# Patient Record
Sex: Female | Born: 1970 | Race: White | Hispanic: No | Marital: Married | Smoking: Never smoker
Health system: Southern US, Community
[De-identification: ages and names within clinical notes are randomized; demographics above are authoritative.]

## PROBLEM LIST (undated history)

## (undated) DIAGNOSIS — I10 Essential (primary) hypertension: Secondary | ICD-10-CM

## (undated) DIAGNOSIS — D649 Anemia, unspecified: Secondary | ICD-10-CM

## (undated) DIAGNOSIS — F419 Anxiety disorder, unspecified: Secondary | ICD-10-CM

## (undated) DIAGNOSIS — F329 Major depressive disorder, single episode, unspecified: Secondary | ICD-10-CM

## (undated) DIAGNOSIS — F32A Depression, unspecified: Secondary | ICD-10-CM

## (undated) DIAGNOSIS — E079 Disorder of thyroid, unspecified: Secondary | ICD-10-CM

---

## 2015-03-06 DIAGNOSIS — F419 Anxiety disorder, unspecified: Secondary | ICD-10-CM | POA: Insufficient documentation

## 2015-03-06 DIAGNOSIS — E0590 Thyrotoxicosis, unspecified without thyrotoxic crisis or storm: Secondary | ICD-10-CM | POA: Insufficient documentation

## 2015-03-06 DIAGNOSIS — F32A Depression, unspecified: Secondary | ICD-10-CM | POA: Insufficient documentation

## 2015-03-06 DIAGNOSIS — F329 Major depressive disorder, single episode, unspecified: Secondary | ICD-10-CM | POA: Insufficient documentation

## 2015-03-06 DIAGNOSIS — I10 Essential (primary) hypertension: Secondary | ICD-10-CM | POA: Insufficient documentation

## 2015-03-06 DIAGNOSIS — N393 Stress incontinence (female) (male): Secondary | ICD-10-CM | POA: Insufficient documentation

## 2015-03-06 DIAGNOSIS — E669 Obesity, unspecified: Secondary | ICD-10-CM | POA: Insufficient documentation

## 2017-05-12 DIAGNOSIS — G43009 Migraine without aura, not intractable, without status migrainosus: Secondary | ICD-10-CM | POA: Insufficient documentation

## 2018-02-12 ENCOUNTER — Observation Stay (HOSPITAL_COMMUNITY)
Admission: EM | Admit: 2018-02-12 | Discharge: 2018-02-14 | Disposition: A | Discharge status: Home / Self Care | Payer: PRIVATE HEALTH INSURANCE | Attending: General Surgery | Admitting: General Surgery

## 2018-02-12 ENCOUNTER — Encounter (HOSPITAL_COMMUNITY): Payer: Self-pay | Admitting: Emergency Medicine

## 2018-02-12 ENCOUNTER — Other Ambulatory Visit: Payer: Self-pay

## 2018-02-12 ENCOUNTER — Emergency Department (HOSPITAL_COMMUNITY): Payer: PRIVATE HEALTH INSURANCE

## 2018-02-12 DIAGNOSIS — K8010 Calculus of gallbladder with chronic cholecystitis without obstruction: Principal | ICD-10-CM | POA: Insufficient documentation

## 2018-02-12 DIAGNOSIS — R1011 Right upper quadrant pain: Secondary | ICD-10-CM

## 2018-02-12 DIAGNOSIS — K8000 Calculus of gallbladder with acute cholecystitis without obstruction: Secondary | ICD-10-CM

## 2018-02-12 DIAGNOSIS — K810 Acute cholecystitis: Secondary | ICD-10-CM | POA: Diagnosis present

## 2018-02-12 DIAGNOSIS — E669 Obesity, unspecified: Secondary | ICD-10-CM | POA: Diagnosis not present

## 2018-02-12 DIAGNOSIS — K7689 Other specified diseases of liver: Secondary | ICD-10-CM | POA: Diagnosis not present

## 2018-02-12 DIAGNOSIS — Z6834 Body mass index (BMI) 34.0-34.9, adult: Secondary | ICD-10-CM | POA: Diagnosis not present

## 2018-02-12 DIAGNOSIS — I10 Essential (primary) hypertension: Secondary | ICD-10-CM | POA: Diagnosis not present

## 2018-02-12 DIAGNOSIS — K819 Cholecystitis, unspecified: Secondary | ICD-10-CM | POA: Diagnosis present

## 2018-02-12 HISTORY — DX: Essential (primary) hypertension: I10

## 2018-02-12 LAB — COMPREHENSIVE METABOLIC PANEL
ALBUMIN: 3.8 g/dL (ref 3.5–5.0)
ALT: 52 U/L — AB (ref 0–44)
AST: 167 U/L — AB (ref 15–41)
Alkaline Phosphatase: 208 U/L — ABNORMAL HIGH (ref 38–126)
Anion gap: 9 (ref 5–15)
BUN: 13 mg/dL (ref 6–20)
CO2: 24 mmol/L (ref 22–32)
CREATININE: 0.72 mg/dL (ref 0.44–1.00)
Calcium: 9.8 mg/dL (ref 8.9–10.3)
Chloride: 105 mmol/L (ref 98–111)
GFR calc Af Amer: >60 mL/min (ref >60)
GFR calc non Af Amer: >60 mL/min (ref >60)
GLUCOSE: 127 mg/dL — AB (ref 70–99)
Potassium: 3.5 mmol/L (ref 3.5–5.1)
SODIUM: 138 mmol/L (ref 135–145)
Total Bilirubin: 0.9 mg/dL (ref 0.3–1.2)
Total Protein: 7.3 g/dL (ref 6.5–8.1)

## 2018-02-12 LAB — LIPASE, BLOOD: LIPASE: 23 U/L (ref 11–51)

## 2018-02-12 LAB — URINALYSIS, ROUTINE W REFLEX MICROSCOPIC
BILIRUBIN URINE: NEGATIVE
Bacteria, UA: NONE SEEN
Glucose, UA: NEGATIVE mg/dL
Ketones, ur: NEGATIVE mg/dL
Nitrite: NEGATIVE
PROTEIN: 100 mg/dL — AB
RBC / HPF: >50 RBC/hpf — ABNORMAL HIGH (ref 0–5)
SPECIFIC GRAVITY, URINE: 1.024 (ref 1.005–1.030)
pH: 7 (ref 5.0–8.0)

## 2018-02-12 LAB — CBC
HCT: 30.1 % — ABNORMAL LOW (ref 36.0–46.0)
Hemoglobin: 8.7 g/dL — ABNORMAL LOW (ref 12.0–15.0)
MCH: 19.6 pg — ABNORMAL LOW (ref 26.0–34.0)
MCHC: 28.9 g/dL — AB (ref 30.0–36.0)
MCV: 67.6 fL — AB (ref 80.0–100.0)
Platelets: 384 10*3/uL (ref 150–400)
RBC: 4.45 MIL/uL (ref 3.87–5.11)
RDW: 19 % — AB (ref 11.5–15.5)
WBC: 11.2 10*3/uL — ABNORMAL HIGH (ref 4.0–10.5)
nRBC: 0 % (ref 0.0–0.2)

## 2018-02-12 LAB — I-STAT BETA HCG BLOOD, ED (MC, WL, AP ONLY)

## 2018-02-12 MED ORDER — BUPROPION HCL ER (XL) 300 MG PO TB24
300.0000 mg | ORAL_TABLET | Freq: Every day | ORAL | Status: DC
  Administered 2018-02-12 – 2018-02-14 (×2): 300 mg via ORAL
  Filled 2018-02-12 (×2): qty 1

## 2018-02-12 MED ORDER — ENOXAPARIN SODIUM 40 MG/0.4ML ~~LOC~~ SOLN: 40.0000 mg | SUBCUTANEOUS | Status: DC

## 2018-02-12 MED ORDER — MORPHINE SULFATE (PF) 4 MG/ML IV SOLN
4.0000 mg | Freq: Once | INTRAVENOUS | Status: AC
  Administered 2018-02-12: 4 mg via INTRAVENOUS
  Filled 2018-02-12: qty 1

## 2018-02-12 MED ORDER — METHIMAZOLE 5 MG PO TABS
2.5000 mg | ORAL_TABLET | Freq: Every day | ORAL | Status: DC
  Administered 2018-02-12: 2.5 mg via ORAL
  Filled 2018-02-12 (×3): qty 1

## 2018-02-12 MED ORDER — ONDANSETRON 4 MG PO TBDP: 4.0000 mg | ORAL_TABLET | Freq: Four times a day (QID) | ORAL | Status: DC | PRN

## 2018-02-12 MED ORDER — VENLAFAXINE HCL ER 150 MG PO CP24
150.0000 mg | ORAL_CAPSULE | Freq: Every day | ORAL | Status: DC
  Administered 2018-02-13 – 2018-02-14 (×2): 150 mg via ORAL
  Filled 2018-02-12 (×2): qty 1

## 2018-02-12 MED ORDER — ACETAMINOPHEN 650 MG RE SUPP: 650.0000 mg | Freq: Four times a day (QID) | RECTAL | Status: DC | PRN

## 2018-02-12 MED ORDER — ONDANSETRON HCL 4 MG/2ML IJ SOLN
4.0000 mg | Freq: Once | INTRAMUSCULAR | Status: AC | PRN
  Administered 2018-02-12: 4 mg via INTRAVENOUS
  Filled 2018-02-12: qty 2

## 2018-02-12 MED ORDER — OXYCODONE HCL 5 MG PO TABS
5.0000 mg | ORAL_TABLET | ORAL | Status: DC | PRN
  Administered 2018-02-12 – 2018-02-14 (×5): 5 mg via ORAL
  Filled 2018-02-12 (×5): qty 1

## 2018-02-12 MED ORDER — SODIUM CHLORIDE 0.9 % IV SOLN
2.0000 g | INTRAVENOUS | Status: DC
  Administered 2018-02-12: 2 g via INTRAVENOUS
  Filled 2018-02-12 (×2): qty 20

## 2018-02-12 MED ORDER — ACETAMINOPHEN 325 MG PO TABS
650.0000 mg | ORAL_TABLET | Freq: Four times a day (QID) | ORAL | Status: DC | PRN
  Filled 2018-02-12: qty 2

## 2018-02-12 MED ORDER — ONDANSETRON HCL 4 MG/2ML IJ SOLN: 4.0000 mg | Freq: Four times a day (QID) | INTRAMUSCULAR | Status: DC | PRN

## 2018-02-12 MED ORDER — SODIUM CHLORIDE 0.9 % IV SOLN
INTRAVENOUS | Status: DC
  Administered 2018-02-12 – 2018-02-13 (×3): via INTRAVENOUS

## 2018-02-12 MED ORDER — HYDROMORPHONE HCL 1 MG/ML IJ SOLN
1.0000 mg | INTRAMUSCULAR | Status: DC | PRN
  Administered 2018-02-12 – 2018-02-14 (×5): 1 mg via INTRAVENOUS
  Filled 2018-02-12 (×5): qty 1

## 2018-02-12 NOTE — H&P (Signed)
Melissa Zamora is an 47 y.o. female.   Chief Complaint: ruq pain HPI: 57 yof consult from Dr Sherry Ruffing for ruq pain. She has several episodes of intermittent ruq pain over past couple months.  She had episode last night after eating that has not abated. Nothing is helping.  No n/v. No fevers. Having normal bowel function.  She came to er and has US showing gallstones, elevated wbc.    Past Medical History:  Diagnosis Date  . Hypertension     History reviewed. No pertinent surgical history.  History reviewed. No pertinent family history. Social History:  reports that she has never smoked. She has never used smokeless tobacco. She reports that she drinks alcohol. She reports that she does not use drugs.  Allergies: No Known Allergies  meds reviewed  Results for orders placed or performed during the hospital encounter of 02/12/18 (from the past 48 hour(s))  Lipase, blood     Status: None   Collection Time: 02/12/18  4:59 AM  Result Value Ref Range   Lipase 23 11 - 51 U/L    Comment: Performed at [facility], [place] [address], [place], [place] [address]  Comprehensive metabolic panel     Status: Abnormal   Collection Time: 02/12/18  4:59 AM  Result Value Ref Range   Sodium 138 135 - 145 mmol/L   Potassium 3.5 3.5 - 5.1 mmol/L   Chloride 105 98 - 111 mmol/L   CO2 24 22 - 32 mmol/L   Glucose, Bld 127 (H) 70 - 99 mg/dL   BUN 13 6 - 20 mg/dL   Creatinine, Ser 0.72 0.44 - 1.00 mg/dL   Calcium 9.8 8.9 - 10.3 mg/dL   Total Protein 7.3 6.5 - 8.1 g/dL   Albumin 3.8 3.5 - 5.0 g/dL   AST 167 (H) 15 - 41 U/L   ALT 52 (H) 0 - 44 U/L   Alkaline Phosphatase 208 (H) 38 - 126 U/L   Total Bilirubin 0.9 0.3 - 1.2 mg/dL   GFR calc non Af Amer >60 >60 mL/min   GFR calc Af Amer >60 >60 mL/min    Comment: (NOTE) The eGFR has been calculated using the CKD EPI equation. This calculation has not been validated in all clinical situations. eGFR's persistently <60 mL/min signify possible  Chronic Kidney Disease.    Anion gap 9 5 - 15    Comment: Performed at [facility], [place] [address], [place], [place] [address]  CBC     Status: Abnormal   Collection Time: 02/12/18  4:59 AM  Result Value Ref Range   WBC 11.2 (H) 4.0 - 10.5 K/uL   RBC 4.45 3.87 - 5.11 MIL/uL   Hemoglobin 8.7 (L) 12.0 - 15.0 g/dL    Comment: Reticulocyte Hemoglobin testing may be clinically indicated, consider ordering this additional test [id number]    HCT 30.1 (L) 36.0 - 46.0 %   MCV 67.6 (L) 80.0 - 100.0 fL   MCH 19.6 (L) 26.0 - 34.0 pg   MCHC 28.9 (L) 30.0 - 36.0 g/dL   RDW 19.0 (H) 11.5 - 15.5 %   Platelets 384 150 - 400 K/uL   nRBC 0.0 0.0 - 0.2 %    Comment: Performed at [facility], [place] [address] [place], [place] [address]  Urinalysis, Routine w reflex microscopic     Status: Abnormal   Collection Time: 02/12/18  4:59 AM  Result Value Ref Range   Color, Urine RED (A)  YELLOW    Comment: BIOCHEMICALS MAY BE AFFECTED BY COLOR   APPearance CLOUDY (A) CLEAR   Specific Gravity, Urine 1.024 1.005 - 1.030   pH 7.0 5.0 - 8.0   Glucose, UA NEGATIVE NEGATIVE mg/dL   Hgb urine dipstick LARGE (A) NEGATIVE   Bilirubin Urine NEGATIVE NEGATIVE   Ketones, ur NEGATIVE NEGATIVE mg/dL   Protein, ur 100 (A) NEGATIVE mg/dL   Nitrite NEGATIVE NEGATIVE   Leukocytes, UA TRACE (A) NEGATIVE   RBC / HPF >50 (H) 0 - 5 RBC/hpf   WBC, UA 0-5 0 - 5 WBC/hpf   Bacteria, UA NONE SEEN NONE SEEN   Squamous Epithelial / LPF 6-10 0 - 5   Mucus PRESENT     Comment: Performed at [facility], [place] [address] [place], [place] [address]  I-Stat beta hCG blood, ED     Status: None   Collection Time: 02/12/18  5:07 AM  Result Value Ref Range   I-stat hCG, quantitative <5.0 <5 mIU/mL   Comment 3            Comment:   GEST. AGE      CONC.  (mIU/mL)   <=1 WEEK        5 - 50     2 WEEKS       50 - 500     3 WEEKS       100 - 10,000     4 WEEKS     1,000 - 30,000         FEMALE AND NON-PREGNANT FEMALE:     LESS THAN 5 mIU/mL    US Abdomen Limited Ruq  Result Date: 02/12/2018 CLINICAL DATA:  Acute onset of right upper quadrant abdominal pain. EXAM: ULTRASOUND ABDOMEN LIMITED RIGHT UPPER QUADRANT COMPARISON:  None. FINDINGS: Gallbladder: Gallstones and sludge are noted within the gallbladder. Stones measure up to 1.9 cm in size. No ultrasonographic Murphy's sign is elicited. No pericholecystic fluid or gallbladder wall thickening is appreciated. Common bile duct: Diameter: 0.5 cm, within normal limits in caliber. Liver: A 2.4 x 1.8 x 1.8 cm cyst is noted at the right hepatic lobe. Within normal limits in parenchymal echogenicity. Portal vein is patent on color Doppler imaging with normal direction of blood flow towards the liver. IMPRESSION: 1. No acute abnormality seen at the right upper quadrant. 2. Stones and sludge noted within the gallbladder. Gallbladder otherwise unremarkable in appearance. 3. 2.4 cm right hepatic cyst noted. Electronically Signed   By: Garald Balding M.D.   On: 02/12/2018 06:36    Review of Systems  Gastrointestinal: Positive for abdominal pain.  All other systems reviewed and are negative.   Blood pressure (!) 164/90, pulse 87, temperature 98.5 F (36.9 C), temperature source Oral, resp. rate 15, height 5' 2"  (1.575 m), weight 86.2 kg, last menstrual period 02/09/2018, SpO2 98 %. Physical Exam  Vitals reviewed. Constitutional: She is oriented to person, place, and time. She appears well-developed and well-nourished.  HENT:  Head: Normocephalic and atraumatic.  Eyes: No scleral icterus.  Neck: Neck supple.  Cardiovascular: Normal rate, regular rhythm and normal heart sounds.  Respiratory: Effort normal. No respiratory distress.  GI: Soft. Bowel sounds are normal. There is tenderness (mild ruq).  Neurological: She is alert and oriented to person, place, and time.  Skin: Skin is warm and dry.  Psychiatric: Her behavior is  normal. Thought content normal.     Assessment/Plan Biliary colic, ? Cholecystitis Due to persistent symptoms I  recommended admission and lap chole. I discussed the procedure in detail. We discussed the risks and benefits of a laparoscopic cholecystectomy and possible cholangiogram including, but not limited to bleeding, infection, injury to surrounding structures such as the intestine or liver, bile leak, retained gallstones, need to convert to an open procedure, prolonged diarrhea, blood clots such as  DVT, common bile duct injury, anesthesia risks, and possible need for additional procedures.  The likelihood of improvement in symptoms and return to the patient's normal status is good. We discussed the typical post-operative recovery course.   Rolm Bookbinder, MD 02/12/2018, 8:15 AM

## 2018-02-12 NOTE — ED Notes (Signed)
Short stay called to state pt will have surgery tomorrow at 3pm due to several scheduled today. Ngozi RN aware

## 2018-02-12 NOTE — ED Triage Notes (Signed)
Patient is complaining of upper left abdominal pain that wraps to her back. Patient states this started last night. Patient is nauseas.

## 2018-02-12 NOTE — ED Provider Notes (Signed)
[facility] DEPT Provider Note   CSN: [id number] Arrival date & time: 02/12/18  0416     History   Chief Complaint Chief Complaint  Patient presents with  . Abdominal Pain  . Back Pain    HPI Melissa Zamora is a 47 y.o. female with history of HTN who presents to the emergency department with a chief complaint of abdominal pain.  The patient endorses constants upper abdominal pain that radiates to her bilateral mid back.  She is unable to characterize the pain, but states it is severe.  Pain began suddenly at 20:30.  She reports she ate a cheeseburger approximately 2 hours prior to onset of pain.  She reports associated nausea.  No known modifying factors, but she states when the pain is at its worst that it takes her breath away.  She reports 3 previous similar episodes of pain over the last month.  She reports the last episode of pain resolved spontaneously after 2 to 3 hours and began after eating chili.  She denies of fever, chills, vomiting, diarrhea, constipation, dysuria, hematuria, flank pain, vaginal pain or discharge, chest pain, or dyspnea.  No treatment prior to arrival.  No history of abdominal surgery.  LMP began 02/09/2018 and she is currently having vaginal bleeding.  Reports that she has only had 3 menstrual cycles in 2019, but she has a history of heavy prolonged periods that will last anywhere from a few days to a few weeks.  The history is provided by the patient. No language interpreter was used.    Past Medical History:  Diagnosis Date  . Hypertension     There are no active problems to display for this patient.   History reviewed. No pertinent surgical history.   OB History   None      Home Medications    Prior to Admission medications   Not on File    Family History History reviewed. No pertinent family history.  Social History Social History   Tobacco Use  . Smoking status: Never Smoker  . Smokeless tobacco:  Never Used  Substance Use Topics  . Alcohol use: Yes  . Drug use: Never     Allergies   Patient has no known allergies.   Review of Systems Review of Systems  Constitutional: Negative for activity change, chills and fever.  Respiratory: Negative for shortness of breath.   Cardiovascular: Negative for chest pain.  Gastrointestinal: Positive for abdominal pain and nausea. Negative for constipation, diarrhea and vomiting.  Genitourinary: Negative for dysuria, flank pain, frequency, urgency, vaginal discharge and vaginal pain.  Musculoskeletal: Negative for back pain, myalgias and neck pain.  Skin: Negative for rash.  Allergic/Immunologic: Negative for immunocompromised state.  Neurological: Negative for weakness, numbness and headaches.  Psychiatric/Behavioral: Negative for confusion.   Physical Exam Updated Vital Signs BP (!) 161/92   Pulse 87   Resp 16   Ht 5' 2"  (1.575 m)   Wt 86.2 kg   LMP 02/09/2018   SpO2 100%   BMI 34.75 kg/m   Physical Exam  Constitutional: No distress.  HENT:  Head: Normocephalic.  Eyes: Conjunctivae are normal.  Neck: Neck supple.  Cardiovascular: Normal rate, regular rhythm, normal heart sounds and intact distal pulses. Exam reveals no gallop and no friction rub.  No murmur heard. Pulmonary/Chest: Effort normal. No stridor. No respiratory distress. She has no wheezes. She has no rales. She exhibits no tenderness.  Abdominal: Soft. Bowel sounds are normal. She exhibits no distension  and no mass. There is tenderness. There is guarding. There is no rebound. No hernia.  Tender to palpation in the epigastric region and right upper quadrant.  Maximal pain is in the right upper quadrant.  Positive Murphy sign.  There is guarding, but no rebound.  No CVA tenderness bilaterally.  Lower abdomen is unremarkable.  Neurological: She is alert.  Skin: Skin is warm. No rash noted.  Psychiatric: Her behavior is normal.  Nursing note and vitals  reviewed.    ED Treatments / Results  Labs (all labs ordered are listed, but only abnormal results are displayed) Labs Reviewed  COMPREHENSIVE METABOLIC PANEL - Abnormal; Notable for the following components:      Result Value   Glucose, Bld 127 (*)    AST 167 (*)    ALT 52 (*)    Alkaline Phosphatase 208 (*)    All other components within normal limits  CBC - Abnormal; Notable for the following components:   WBC 11.2 (*)    Hemoglobin 8.7 (*)    HCT 30.1 (*)    MCV 67.6 (*)    MCH 19.6 (*)    MCHC 28.9 (*)    RDW 19.0 (*)    All other components within normal limits  URINALYSIS, ROUTINE W REFLEX MICROSCOPIC - Abnormal; Notable for the following components:   Color, Urine RED (*)    APPearance CLOUDY (*)    Hgb urine dipstick LARGE (*)    Protein, ur 100 (*)    Leukocytes, UA TRACE (*)    RBC / HPF >50 (*)    All other components within normal limits  LIPASE, BLOOD  I-STAT BETA HCG BLOOD, ED ([facility], [facility], [facility] ONLY)    EKG None  Radiology US Abdomen Limited Ruq  Result Date: 02/12/2018 CLINICAL DATA:  Acute onset of right upper quadrant abdominal pain. EXAM: ULTRASOUND ABDOMEN LIMITED RIGHT UPPER QUADRANT COMPARISON:  None. FINDINGS: Gallbladder: Gallstones and sludge are noted within the gallbladder. Stones measure up to 1.9 cm in size. No ultrasonographic Murphy's sign is elicited. No pericholecystic fluid or gallbladder wall thickening is appreciated. Common bile duct: Diameter: 0.5 cm, within normal limits in caliber. Liver: A 2.4 x 1.8 x 1.8 cm cyst is noted at the right hepatic lobe. Within normal limits in parenchymal echogenicity. Portal vein is patent on color Doppler imaging with normal direction of blood flow towards the liver. IMPRESSION: 1. No acute abnormality seen at the right upper quadrant. 2. Stones and sludge noted within the gallbladder. Gallbladder otherwise unremarkable in appearance. 3. 2.4 cm right hepatic cyst noted. Electronically Signed   By: Garald Balding M.D.   On: 02/12/2018 06:36    Procedures Procedures (including critical care time)  Medications Ordered in ED Medications  ondansetron (ZOFRAN) injection 4 mg (4 mg Intravenous Given 02/12/18 0600)  morphine 4 MG/ML injection 4 mg (4 mg Intravenous Given 02/12/18 0600)  morphine 4 MG/ML injection 4 mg (4 mg Intravenous Given 02/12/18 0624)     Initial Impression / Assessment and Plan / ED Course  I have reviewed the triage vital signs and the nursing notes.  Pertinent labs & imaging results that were available during my care of the patient were reviewed by me and considered in my medical decision making (see chart for details).     47 year old female with a history of hypertension presenting with abdominal pain and nausea, onset 2030.  She reports 3 previous episodes of similar pain within the last month.  Pain  began approximately 2 hours after eating a cheeseburger.  On exam, she has a positive Murphy sign and is tender in the upper abdomen, more focally in the right upper quadrant. Will order basic labs and right upper quadrant ultrasound.  Leukocytosis of 11.2.  AST 167, ALT 52, Alk phos 208.  Right upper quadrant ultrasound with multiple gallstones measuring up to 1.9 cm in size.  No acute abnormality on ultrasound.  She is anemic with a hemoglobin of 8.7.  In 2016, hemoglobin was between 12 and 13 when reviewed through care everywhere.  Suspect this is secondary to abnormal uterine bleeding.  UA with hematuria, which is secondary to the patient's current menstrual cycle.  Consulted general surgery and spoke with Dr. Johney Maine. Surgery will come to see the patient. Patient care transferred to PA Geiple at the end of my shift. Patient presentation, ED course, and plan of care discussed with review of all pertinent labs and imaging. Please see his/her note for further details regarding further ED course and disposition.  Final Clinical Impressions(s) / ED Diagnoses   Final  diagnoses:  RUQ abdominal pain    ED Discharge Orders    None       Joanne Gavel, PA-C 02/12/18 0711    Shanon Rosser, MD 02/12/18 2244

## 2018-02-12 NOTE — ED Notes (Signed)
ED TO INPATIENT HANDOFF REPORT  Name/Age/Gender Melissa Zamora 47 y.o. female  Code Status    Code Status Orders  (From admission, onward)         Start     Ordered   02/12/18 1323  Full code  Continuous     02/12/18 1322        Code Status History    This patient has a current code status but no historical code status.      Home/SNF/Other Home  Chief Complaint Abdominal/Back Pain  Level of Care/Admitting Diagnosis ED Disposition    ED Disposition Condition Comment   Admit  Hospital Area: [facility] [[id number]]  Level of Care: Med-Surg [16]  Diagnosis: Acute cholecystitis [575.0.ICD-9-CM]  Admitting Physician: Rolm Bookbinder [[id number]]  Attending Physician: CCS, MD [[id number]]  Bed request comments: 5W  PT Class (Do Not Modify): Observation [104]  PT Acc Code (Do Not Modify): Observation [10022]       Medical History Past Medical History:  Diagnosis Date  . Hypertension     Allergies No Known Allergies  IV Location/Drains/Wounds Patient Lines/Drains/Airways Status   Active Line/Drains/Airways    Name:   Placement date:   Placement time:   Site:   Days:   Peripheral IV 02/12/18 Right Hand   02/12/18    0600    Hand   less than 1          Labs/Imaging Results for orders placed or performed during the hospital encounter of 02/12/18 (from the past 48 hour(s))  Lipase, blood     Status: None   Collection Time: 02/12/18  4:59 AM  Result Value Ref Range   Lipase 23 11 - 51 U/L    Comment: Performed at [facility], [place] [address] [place], [place] [address]  Comprehensive metabolic panel     Status: Abnormal   Collection Time: 02/12/18  4:59 AM  Result Value Ref Range   Sodium 138 135 - 145 mmol/L   Potassium 3.5 3.5 - 5.1 mmol/L   Chloride 105 98 - 111 mmol/L   CO2 24 22 - 32 mmol/L   Glucose, Bld 127 (H) 70 - 99 mg/dL   BUN 13 6 - 20 mg/dL   Creatinine, Ser 0.72 0.44 - 1.00 mg/dL   Calcium 9.8 8.9 - 10.3 mg/dL    Total Protein 7.3 6.5 - 8.1 g/dL   Albumin 3.8 3.5 - 5.0 g/dL   AST 167 (H) 15 - 41 U/L   ALT 52 (H) 0 - 44 U/L   Alkaline Phosphatase 208 (H) 38 - 126 U/L   Total Bilirubin 0.9 0.3 - 1.2 mg/dL   GFR calc non Af Amer >60 >60 mL/min   GFR calc Af Amer >60 >60 mL/min    Comment: (NOTE) The eGFR has been calculated using the CKD EPI equation. This calculation has not been validated in all clinical situations. eGFR's persistently <60 mL/min signify possible Chronic Kidney Disease.    Anion gap 9 5 - 15    Comment: Performed at [facility], [place] [address], [place], [place] [address]  CBC     Status: Abnormal   Collection Time: 02/12/18  4:59 AM  Result Value Ref Range   WBC 11.2 (H) 4.0 - 10.5 K/uL   RBC 4.45 3.87 - 5.11 MIL/uL   Hemoglobin 8.7 (L) 12.0 - 15.0 g/dL    Comment: Reticulocyte Hemoglobin testing may be clinically indicated, consider ordering this additional test [id number]  HCT 30.1 (L) 36.0 - 46.0 %   MCV 67.6 (L) 80.0 - 100.0 fL   MCH 19.6 (L) 26.0 - 34.0 pg   MCHC 28.9 (L) 30.0 - 36.0 g/dL   RDW 19.0 (H) 11.5 - 15.5 %   Platelets 384 150 - 400 K/uL   nRBC 0.0 0.0 - 0.2 %    Comment: Performed at [facility], [place] [address] [place], [place] [address]  Urinalysis, Routine w reflex microscopic     Status: Abnormal   Collection Time: 02/12/18  4:59 AM  Result Value Ref Range   Color, Urine RED (A) YELLOW    Comment: BIOCHEMICALS MAY BE AFFECTED BY COLOR   APPearance CLOUDY (A) CLEAR   Specific Gravity, Urine 1.024 1.005 - 1.030   pH 7.0 5.0 - 8.0   Glucose, UA NEGATIVE NEGATIVE mg/dL   Hgb urine dipstick LARGE (A) NEGATIVE   Bilirubin Urine NEGATIVE NEGATIVE   Ketones, ur NEGATIVE NEGATIVE mg/dL   Protein, ur 100 (A) NEGATIVE mg/dL   Nitrite NEGATIVE NEGATIVE   Leukocytes, UA TRACE (A) NEGATIVE   RBC / HPF >50 (H) 0 - 5 RBC/hpf   WBC, UA 0-5 0 - 5 WBC/hpf   Bacteria, UA NONE SEEN NONE SEEN   Squamous Epithelial /  LPF 6-10 0 - 5   Mucus PRESENT     Comment: Performed at [facility], [place] [address] [place],  [address]  I-Stat beta hCG blood, ED     Status: None   Collection Time: 02/12/18  5:07 AM  Result Value Ref Range   I-stat hCG, quantitative <5.0 <5 mIU/mL   Comment 3            Comment:   GEST. AGE      CONC.  (mIU/mL)   <=1 WEEK        5 - 50     2 WEEKS       50 - 500     3 WEEKS       100 - 10,000     4 WEEKS     1,000 - 30,000        FEMALE AND NON-PREGNANT FEMALE:     LESS THAN 5 mIU/mL    US Abdomen Limited Ruq  Result Date: 02/12/2018 CLINICAL DATA:  Acute onset of right upper quadrant abdominal pain. EXAM: ULTRASOUND ABDOMEN LIMITED RIGHT UPPER QUADRANT COMPARISON:  None. FINDINGS: Gallbladder: Gallstones and sludge are noted within the gallbladder. Stones measure up to 1.9 cm in size. No ultrasonographic Murphy's sign is elicited. No pericholecystic fluid or gallbladder wall thickening is appreciated. Common bile duct: Diameter: 0.5 cm, within normal limits in caliber. Liver: A 2.4 x 1.8 x 1.8 cm cyst is noted at the right hepatic lobe. Within normal limits in parenchymal echogenicity. Portal vein is patent on color Doppler imaging with normal direction of blood flow towards the liver. IMPRESSION: 1. No acute abnormality seen at the right upper quadrant. 2. Stones and sludge noted within the gallbladder. Gallbladder otherwise unremarkable in appearance. 3. 2.4 cm right hepatic cyst noted. Electronically Signed   By: Garald Balding M.D.   On: 02/12/2018 06:36    Pending Labs Unresulted Labs (From admission, onward)    Start     Ordered   02/19/18 0500  Creatinine, serum  (enoxaparin (LOVENOX)    CrCl >/= 30 ml/min)  Weekly,   R    Comments:  while on enoxaparin therapy    02/12/18  1322   02/13/18 0500  Comprehensive metabolic panel  Tomorrow morning,   R     02/12/18 1323   02/13/18 0500  CBC  Tomorrow morning,   R     02/12/18 1323           Vitals/Pain Today's Vitals   02/12/18 0900 02/12/18 1000 02/12/18 1200 02/12/18 1411  BP: (!) 166/99 (!) 153/92 127/74 (!) 141/89  Pulse: 84 82 82 85  Resp:  16 16 16   Temp:      TempSrc:      SpO2: 98% 97% 98% 99%  Weight:      Height:      PainSc:        Isolation Precautions No active isolations  Medications Medications  enoxaparin (LOVENOX) injection 40 mg (has no administration in time range)  0.9 %  sodium chloride infusion ( Intravenous New Bag/Given 02/12/18 1435)  cefTRIAXone (ROCEPHIN) 2 g in sodium chloride 0.9 % 100 mL IVPB (2 g Intravenous New Bag/Given 02/12/18 1438)  acetaminophen (TYLENOL) tablet 650 mg (has no administration in time range)    Or  acetaminophen (TYLENOL) suppository 650 mg (has no administration in time range)  ondansetron (ZOFRAN-ODT) disintegrating tablet 4 mg (has no administration in time range)    Or  ondansetron (ZOFRAN) injection 4 mg (has no administration in time range)  ondansetron (ZOFRAN) injection 4 mg (4 mg Intravenous Given 02/12/18 0600)  morphine 4 MG/ML injection 4 mg (4 mg Intravenous Given 02/12/18 0600)  morphine 4 MG/ML injection 4 mg (4 mg Intravenous Given 02/12/18 0624)    Mobility walks

## 2018-02-13 ENCOUNTER — Encounter (HOSPITAL_COMMUNITY): Payer: Self-pay | Admitting: Registered Nurse

## 2018-02-13 ENCOUNTER — Observation Stay (HOSPITAL_COMMUNITY): Payer: PRIVATE HEALTH INSURANCE | Admitting: Registered Nurse

## 2018-02-13 ENCOUNTER — Encounter (HOSPITAL_COMMUNITY)
Admission: EM | Disposition: A | Discharge status: Home / Self Care | Payer: Self-pay | Source: Home / Self Care | Attending: Emergency Medicine

## 2018-02-13 HISTORY — PX: CHOLECYSTECTOMY: SHX55

## 2018-02-13 LAB — COMPREHENSIVE METABOLIC PANEL
ALBUMIN: 3.4 g/dL — AB (ref 3.5–5.0)
ALT: 76 U/L — ABNORMAL HIGH (ref 0–44)
AST: 91 U/L — AB (ref 15–41)
Alkaline Phosphatase: 159 U/L — ABNORMAL HIGH (ref 38–126)
Anion gap: 7 (ref 5–15)
BILIRUBIN TOTAL: 0.4 mg/dL (ref 0.3–1.2)
BUN: 8 mg/dL (ref 6–20)
CALCIUM: 8.1 mg/dL — AB (ref 8.9–10.3)
CO2: 26 mmol/L (ref 22–32)
Chloride: 107 mmol/L (ref 98–111)
Creatinine, Ser: 0.65 mg/dL (ref 0.44–1.00)
GFR calc Af Amer: >60 mL/min (ref >60)
GLUCOSE: 102 mg/dL — AB (ref 70–99)
Potassium: 3 mmol/L — ABNORMAL LOW (ref 3.5–5.1)
Sodium: 140 mmol/L (ref 135–145)
TOTAL PROTEIN: 6.4 g/dL — AB (ref 6.5–8.1)

## 2018-02-13 LAB — CBC
HCT: 29.5 % — ABNORMAL LOW (ref 36.0–46.0)
Hemoglobin: 8.3 g/dL — ABNORMAL LOW (ref 12.0–15.0)
MCH: 19.2 pg — AB (ref 26.0–34.0)
MCHC: 28.1 g/dL — AB (ref 30.0–36.0)
MCV: 68.3 fL — ABNORMAL LOW (ref 80.0–100.0)
PLATELETS: 301 10*3/uL (ref 150–400)
RBC: 4.32 MIL/uL (ref 3.87–5.11)
RDW: 19.1 % — AB (ref 11.5–15.5)
WBC: 7.3 10*3/uL (ref 4.0–10.5)
nRBC: 0 % (ref 0.0–0.2)

## 2018-02-13 SURGERY — LAPAROSCOPIC CHOLECYSTECTOMY WITH INTRAOPERATIVE CHOLANGIOGRAM
Anesthesia: General

## 2018-02-13 MED ORDER — ONDANSETRON HCL 4 MG/2ML IJ SOLN
INTRAMUSCULAR | Status: DC | PRN
  Administered 2018-02-13: 4 mg via INTRAVENOUS

## 2018-02-13 MED ORDER — MEPERIDINE HCL 50 MG/ML IJ SOLN: 6.2500 mg | INTRAMUSCULAR | Status: DC | PRN

## 2018-02-13 MED ORDER — LACTATED RINGERS IV SOLN
INTRAVENOUS | Status: AC | PRN
  Administered 2018-02-13: 1000 mL

## 2018-02-13 MED ORDER — OXYCODONE HCL 5 MG PO TABS: 5.0000 mg | ORAL_TABLET | Freq: Once | ORAL | Status: DC | PRN

## 2018-02-13 MED ORDER — SUGAMMADEX SODIUM 200 MG/2ML IV SOLN
INTRAVENOUS | Status: DC | PRN
  Administered 2018-02-13: 200 mg via INTRAVENOUS

## 2018-02-13 MED ORDER — LIP MEDEX EX OINT
TOPICAL_OINTMENT | CUTANEOUS | Status: AC
  Administered 2018-02-13: 16:00:00
  Filled 2018-02-13: qty 7

## 2018-02-13 MED ORDER — ROCURONIUM BROMIDE 10 MG/ML (PF) SYRINGE
PREFILLED_SYRINGE | INTRAVENOUS | Status: AC
  Filled 2018-02-13: qty 10

## 2018-02-13 MED ORDER — OXYCODONE HCL 5 MG/5ML PO SOLN
5.0000 mg | Freq: Once | ORAL | Status: DC | PRN
  Filled 2018-02-13: qty 5

## 2018-02-13 MED ORDER — MIDAZOLAM HCL 2 MG/2ML IJ SOLN
INTRAMUSCULAR | Status: AC
  Filled 2018-02-13: qty 2

## 2018-02-13 MED ORDER — IBUPROFEN 200 MG PO TABS
400.0000 mg | ORAL_TABLET | Freq: Three times a day (TID) | ORAL | Status: DC
  Administered 2018-02-13 – 2018-02-14 (×3): 400 mg via ORAL
  Filled 2018-02-13 (×3): qty 2

## 2018-02-13 MED ORDER — HYDROMORPHONE HCL 1 MG/ML IJ SOLN
INTRAMUSCULAR | Status: AC
  Filled 2018-02-13: qty 1

## 2018-02-13 MED ORDER — 0.9 % SODIUM CHLORIDE (POUR BTL) OPTIME
TOPICAL | Status: DC | PRN
  Administered 2018-02-13: 1000 mL

## 2018-02-13 MED ORDER — PROPOFOL 10 MG/ML IV BOLUS
INTRAVENOUS | Status: AC
  Filled 2018-02-13: qty 20

## 2018-02-13 MED ORDER — FENTANYL CITRATE (PF) 250 MCG/5ML IJ SOLN
INTRAMUSCULAR | Status: AC
  Filled 2018-02-13: qty 5

## 2018-02-13 MED ORDER — LIDOCAINE 2% (20 MG/ML) 5 ML SYRINGE
INTRAMUSCULAR | Status: AC
  Filled 2018-02-13: qty 5

## 2018-02-13 MED ORDER — ONDANSETRON HCL 4 MG/2ML IJ SOLN
INTRAMUSCULAR | Status: AC
  Filled 2018-02-13: qty 2

## 2018-02-13 MED ORDER — CEFAZOLIN SODIUM-DEXTROSE 2-4 GM/100ML-% IV SOLN
INTRAVENOUS | Status: AC
  Filled 2018-02-13: qty 100

## 2018-02-13 MED ORDER — SODIUM CHLORIDE 0.9 % IV SOLN
INTRAVENOUS | Status: DC
  Administered 2018-02-13: 16:00:00 via INTRAVENOUS

## 2018-02-13 MED ORDER — BUPIVACAINE HCL (PF) 0.25 % IJ SOLN
INTRAMUSCULAR | Status: DC | PRN
  Administered 2018-02-13: 20 mL

## 2018-02-13 MED ORDER — CEFAZOLIN SODIUM-DEXTROSE 2-3 GM-%(50ML) IV SOLR
INTRAVENOUS | Status: DC | PRN
  Administered 2018-02-13: 2 g via INTRAVENOUS

## 2018-02-13 MED ORDER — BUPIVACAINE HCL (PF) 0.25 % IJ SOLN
INTRAMUSCULAR | Status: AC
  Filled 2018-02-13: qty 30

## 2018-02-13 MED ORDER — PROMETHAZINE HCL 25 MG/ML IJ SOLN: 6.2500 mg | INTRAMUSCULAR | Status: DC | PRN

## 2018-02-13 MED ORDER — DEXAMETHASONE SODIUM PHOSPHATE 10 MG/ML IJ SOLN
INTRAMUSCULAR | Status: AC
  Filled 2018-02-13: qty 1

## 2018-02-13 MED ORDER — HYDROMORPHONE HCL 1 MG/ML IJ SOLN
0.2500 mg | INTRAMUSCULAR | Status: DC | PRN
  Administered 2018-02-13 (×3): 0.5 mg via INTRAVENOUS

## 2018-02-13 MED ORDER — ROCURONIUM BROMIDE 10 MG/ML (PF) SYRINGE
PREFILLED_SYRINGE | INTRAVENOUS | Status: DC | PRN
  Administered 2018-02-13: 50 mg via INTRAVENOUS

## 2018-02-13 MED ORDER — LIDOCAINE 2% (20 MG/ML) 5 ML SYRINGE
INTRAMUSCULAR | Status: DC | PRN
  Administered 2018-02-13: 60 mg via INTRAVENOUS

## 2018-02-13 MED ORDER — PROPOFOL 10 MG/ML IV BOLUS
INTRAVENOUS | Status: DC | PRN
  Administered 2018-02-13: 160 mg via INTRAVENOUS

## 2018-02-13 MED ORDER — MORPHINE SULFATE (PF) 4 MG/ML IV SOLN: 1.0000 mg | INTRAVENOUS | Status: DC | PRN

## 2018-02-13 MED ORDER — DEXAMETHASONE SODIUM PHOSPHATE 10 MG/ML IJ SOLN
INTRAMUSCULAR | Status: DC | PRN
  Administered 2018-02-13: 10 mg via INTRAVENOUS

## 2018-02-13 MED ORDER — SUGAMMADEX SODIUM 200 MG/2ML IV SOLN
INTRAVENOUS | Status: AC
  Filled 2018-02-13: qty 2

## 2018-02-13 MED ORDER — MIDAZOLAM HCL 5 MG/5ML IJ SOLN
INTRAMUSCULAR | Status: DC | PRN
  Administered 2018-02-13: 2 mg via INTRAVENOUS

## 2018-02-13 MED ORDER — OXYCODONE HCL 5 MG PO TABS: 5.0000 mg | ORAL_TABLET | ORAL | 0 refills | Status: AC | PRN

## 2018-02-13 MED ORDER — LACTATED RINGERS IV SOLN
INTRAVENOUS | Status: DC
  Administered 2018-02-13: 11:00:00 via INTRAVENOUS

## 2018-02-13 MED ORDER — FENTANYL CITRATE (PF) 250 MCG/5ML IJ SOLN
INTRAMUSCULAR | Status: DC | PRN
  Administered 2018-02-13: 50 ug via INTRAVENOUS
  Administered 2018-02-13: 100 ug via INTRAVENOUS
  Administered 2018-02-13 (×2): 50 ug via INTRAVENOUS

## 2018-02-13 SURGICAL SUPPLY — 44 items
APPLIER CLIP 5 13 M/L LIGAMAX5 (MISCELLANEOUS) ×3
APPLIER CLIP ROT 10 11.4 M/L (STAPLE)
BENZOIN TINCTURE PRP APPL 2/3 (GAUZE/BANDAGES/DRESSINGS) IMPLANT
CABLE HIGH FREQUENCY MONO STRZ (ELECTRODE) ×3 IMPLANT
CHLORAPREP W/TINT 26ML (MISCELLANEOUS) ×3 IMPLANT
CLIP APPLIE 5 13 M/L LIGAMAX5 (MISCELLANEOUS) ×1 IMPLANT
CLIP APPLIE ROT 10 11.4 M/L (STAPLE) IMPLANT
CLOSURE WOUND 1/2 X4 (GAUZE/BANDAGES/DRESSINGS)
COVER MAYO STAND STRL (DRAPES) IMPLANT
COVER SURGICAL LIGHT HANDLE (MISCELLANEOUS) ×3 IMPLANT
COVER WAND RF STERILE (DRAPES) IMPLANT
DECANTER SPIKE VIAL GLASS SM (MISCELLANEOUS) ×3 IMPLANT
DERMABOND ADVANCED (GAUZE/BANDAGES/DRESSINGS) ×2
DERMABOND ADVANCED .7 DNX12 (GAUZE/BANDAGES/DRESSINGS) ×1 IMPLANT
DEVICE TROCAR PUNCTURE CLOSURE (ENDOMECHANICALS) ×3 IMPLANT
DRAPE C-ARM 42X120 X-RAY (DRAPES) IMPLANT
ELECT REM PT RETURN 15FT ADLT (MISCELLANEOUS) ×3 IMPLANT
GLOVE BIO SURGEON STRL SZ7 (GLOVE) ×3 IMPLANT
GLOVE BIOGEL PI IND STRL 7.5 (GLOVE) ×1 IMPLANT
GLOVE BIOGEL PI INDICATOR 7.5 (GLOVE) ×2
GOWN STRL REUS W/TWL LRG LVL3 (GOWN DISPOSABLE) ×3 IMPLANT
GOWN STRL REUS W/TWL XL LVL3 (GOWN DISPOSABLE) ×6 IMPLANT
HEMOSTAT SNOW SURGICEL 2X4 (HEMOSTASIS) ×2 IMPLANT
KIT BASIN OR (CUSTOM PROCEDURE TRAY) ×3 IMPLANT
POUCH RETRIEVAL ECOSAC 10 (ENDOMECHANICALS) ×1 IMPLANT
POUCH RETRIEVAL ECOSAC 10MM (ENDOMECHANICALS) ×2
PROTECTOR NERVE ULNAR (MISCELLANEOUS) IMPLANT
SCISSORS LAP 5X35 DISP (ENDOMECHANICALS) ×3 IMPLANT
SET CHOLANGIOGRAPH MIX (MISCELLANEOUS) IMPLANT
SET IRRIG TUBING LAPAROSCOPIC (IRRIGATION / IRRIGATOR) ×3 IMPLANT
SLEEVE XCEL OPT CAN 5 100 (ENDOMECHANICALS) ×6 IMPLANT
STRIP CLOSURE SKIN 1/2X4 (GAUZE/BANDAGES/DRESSINGS) IMPLANT
SURGICEL SNOW 2X4 (HEMOSTASIS) ×3 IMPLANT
SUT MNCRL AB 4-0 PS2 18 (SUTURE) ×3 IMPLANT
SUT VICRYL 0 TIES 12 18 (SUTURE) IMPLANT
SUT VICRYL 0 UR6 27IN ABS (SUTURE) IMPLANT
TAPE STRIPS DRAPE STRL (GAUZE/BANDAGES/DRESSINGS) ×3 IMPLANT
TOWEL OR 17X26 10 PK STRL BLUE (TOWEL DISPOSABLE) ×3 IMPLANT
TOWEL OR NON WOVEN STRL DISP B (DISPOSABLE) ×3 IMPLANT
TRAY LAPAROSCOPIC (CUSTOM PROCEDURE TRAY) ×3 IMPLANT
TROCAR BLADELESS OPT 5 100 (ENDOMECHANICALS) ×3 IMPLANT
TROCAR XCEL BLUNT TIP 100MML (ENDOMECHANICALS) ×3 IMPLANT
TROCAR XCEL NON-BLD 11X100MML (ENDOMECHANICALS) IMPLANT
TUBING INSUF HEATED (TUBING) ×3 IMPLANT

## 2018-02-13 NOTE — Transfer of Care (Signed)
Immediate Anesthesia Transfer of Care Note  Patient: Melissa Zamora  Procedure(s) Performed: LAPAROSCOPIC CHOLECYSTECTOMY (N/A )  Patient Location: PACU  Anesthesia Type:General  Level of Consciousness: sedated  Airway & Oxygen Therapy: Patient Spontanous Breathing and Patient connected to face mask oxygen  Post-op Assessment: Report given to RN and Post -op Vital signs reviewed and stable  Post vital signs: Reviewed and stable  Last Vitals:  Vitals Value Taken Time  BP    Temp    Pulse 91 02/13/2018  1:29 PM  Resp 10 02/13/2018  1:29 PM  SpO2 96 % 02/13/2018  1:29 PM  Vitals shown include unvalidated device data.  Last Pain:  Vitals:   02/13/18 1028  TempSrc:   PainSc: 10-Worst pain ever      Patients Stated Pain Goal: 2 (02/13/18 1028)  Complications: No apparent anesthesia complications

## 2018-02-13 NOTE — Anesthesia Procedure Notes (Signed)
Procedure Name: Intubation Date/Time: 02/13/2018 12:29 PM Performed by: Talbot Grumbling, CRNA Pre-anesthesia Checklist: Patient identified, Emergency Drugs available, Suction available and Patient being monitored Patient Re-evaluated:Patient Re-evaluated prior to induction Oxygen Delivery Method: Circle system utilized Preoxygenation: Pre-oxygenation with 100% oxygen Induction Type: IV induction Ventilation: Mask ventilation without difficulty Laryngoscope Size: Mac and 3 Grade View: Grade I Tube type: Oral Tube size: 7.0 mm Number of attempts: 1 Airway Equipment and Method: Stylet Placement Confirmation: ETT inserted through vocal cords under direct vision,  positive ETCO2 and breath sounds checked- equal and bilateral Secured at: 21 cm Tube secured with: Tape Dental Injury: Teeth and Oropharynx as per pre-operative assessment

## 2018-02-13 NOTE — Progress Notes (Signed)
   Subjective/Chief Complaint: Feels fine awaiting surgery   Objective: Vital signs in last 24 hours: Temp:  [97.3 F (36.3 C)-98.2 F (36.8 C)] 97.3 F (36.3 C) (11/15 0551) Pulse Rate:  [77-94] 77 (11/15 0551) Resp:  [15-18] 18 (11/15 0551) BP: (127-167)/(74-99) 140/82 (11/15 0551) SpO2:  [93 %-99 %] 93 % (11/15 0551)    Intake/Output from previous day: 11/14 0701 - 11/15 0700 In: 233.2 [I.V.:133.2; IV Piggyback:100] Out: -  Intake/Output this shift: No intake/output data recorded.  GI: soft nt/nd  Lab Results:  Recent Labs    02/12/18 0459 02/13/18 0611  WBC 11.2* 7.3  HGB 8.7* 8.3*  HCT 30.1* 29.5*  PLT 384 301   BMET Recent Labs    02/12/18 0459 02/13/18 0611  NA 138 140  K 3.5 3.0*  CL 105 107  CO2 24 26  GLUCOSE 127* 102*  BUN 13 8  CREATININE 0.72 0.65  CALCIUM 9.8 8.1*   PT/INR No results for input(s): LABPROT, INR in the last 72 hours. ABG No results for input(s): PHART, HCO3 in the last 72 hours.  Invalid input(s): PCO2, PO2  Studies/Results: [place] Abdomen Limited Ruq  Result Date: 02/12/2018 CLINICAL DATA:  Acute onset of right upper quadrant abdominal pain. EXAM: ULTRASOUND ABDOMEN LIMITED RIGHT UPPER QUADRANT COMPARISON:  None. FINDINGS: Gallbladder: Gallstones and sludge are noted within the gallbladder. Stones measure up to 1.9 cm in size. No ultrasonographic Murphy's sign is elicited. No pericholecystic fluid or gallbladder wall thickening is appreciated. Common bile duct: Diameter: 0.5 cm, within normal limits in caliber. Liver: A 2.4 x 1.8 x 1.8 cm cyst is noted at the right hepatic lobe. Within normal limits in parenchymal echogenicity. Portal vein is patent on color Doppler imaging with normal direction of blood flow towards the liver. IMPRESSION: 1. No acute abnormality seen at the right upper quadrant. 2. Stones and sludge noted within the gallbladder. Gallbladder otherwise unremarkable in appearance. 3. 2.4 cm right hepatic cyst  noted. Electronically Signed   By: Roanna RaiderJeffery  Chang M.D.   On: 02/12/2018 06:36    Anti-infectives: Anti-infectives (From admission, onward)   Start     Dose/Rate Route Frequency Ordered Stop   02/12/18 1400  cefTRIAXone (ROCEPHIN) 2 g in sodium chloride 0.9 % 100 mL IVPB     2 g 200 mL/hr over 30 Minutes Intravenous Every 24 hours 02/12/18 1322        Assessment/Plan: Biliary colic, ? Cholecystitis Plan lap chole today  Melissa Zamora 02/13/2018

## 2018-02-13 NOTE — Op Note (Signed)
Preoperative diagnosis:Acute cholecystitis Postoperative diagnosis: Same as above Procedure: Laparoscopic cholecystectomy  Surgeon Dr. Harden MoMatt Birdia Jaycox Asst: Wells GuilesKelly Rayburn, PA-C Anesthesia: General Specimens: Gallbladder and contents to pathology Estimated blood loss: Minimal Complications: None Drains: None Sponge needle count was correct at completion Disposition to recovery stable condition  Indications: This is a47 yof with acute cholecystitis and us with gallstones. We discussed laparoscopic cholecystectomy.  Discussed surgery, risks and recovery  Procedure: After informed consent was obtained the patient was taken to the operating room. She was given antibiotics. SCDs were placed. She was placed under general anesthesia without complication. Her abdomen was prepped and draped in the standard sterile surgical fashion. Surgical timeout was then performed.  I infiltrated Marcaine below the umbilicus and made a vertical incision. I identified the fascia and entered this sharply. I then entered the peritoneum bluntly. I placed a 0 Vicryl pursestring suture to the fascia. I inserted a Hassan trocar and insufflated the abdomen to 15 mmHg pressure. I then inserted for 3 further 5 mm trocars in the right upper quadrant and epigastrium. These were inserted under direct vision without complication.  her gallbladder had evidence of cholecystitis. I then was able to retract the gallbladder cephalad and lateral. I dissected the triangle and was able to identify both the cystic duct and thecystic artery. I did obtain the critical view of safety. I then clipped the artery 3 times and divided leaving 2 clips in place. I then clipped the cystic duct three times and divided it leaving two clips in place. . The clips traversed the duct and the duct was viable. I then removedthe gallbladder from the liver bed and placed in a bag and removed. Hemostasis was then obtained. I irrigated this  was clear. The clipslookedto be in good position. I did place a piece of surgicel snow in the liver bed. I then [facility] the [facility] and tied my pursestring down. I used the suture passer to place an additional 2 0 Vicryl suture to obliterate this defect. I then remove the remaining trocars and desufflated the abdomen. These were closed with 4-0 Monocryl and glue. She tolerated this well was extubated and transferred to the recovery room in stable condition.

## 2018-02-13 NOTE — Discharge Instructions (Signed)
[facility] LAPAROSCOPIC SURGERY: POST OP INSTRUCTIONS Take 400 mg of ibuprofen every 8 hours for next 3 days. Use narcotic as needed.  Use ice several times daily Always review your discharge instruction sheet given to you by the facility where your surgery was performed. IF YOU HAVE DISABILITY OR FAMILY LEAVE FORMS, YOU MUST BRING THEM TO THE OFFICE FOR PROCESSING.   DO NOT GIVE THEM TO YOUR DOCTOR.  PAIN CONTROL  1. First take acetaminophen (Tylenol) AND/or ibuprofen (Advil) to control your pain after surgery.  Follow directions on package.  Taking acetaminophen (Tylenol) and/or ibuprofen (Advil) regularly after surgery will help to control your pain and lower the amount of prescription pain medication you may need.  You should not take more than 3,000 mg (3 grams) of acetaminophen (Tylenol) in 24 hours.  You should not take ibuprofen (Advil), aleve, motrin, naprosyn or other NSAIDS if you have a history of stomach ulcers or chronic kidney disease.  2. A prescription for pain medication may be given to you upon discharge.  Take your pain medication as prescribed, if you still have uncontrolled pain after taking acetaminophen (Tylenol) or ibuprofen (Advil). 3. Use ice packs to help control pain. 4. If you need a refill on your pain medication, please contact your pharmacy.  They will contact our office to request authorization. Prescriptions will not be filled after 5pm or on week-ends.  HOME MEDICATIONS 5. Take your usually prescribed medications unless otherwise directed.  DIET 6. You should follow a light diet the first few days after arrival home.  Be sure to include lots of fluids daily. Avoid fatty, fried foods.   CONSTIPATION 7. It is common to experience some constipation after surgery and if you are taking pain medication.  Increasing fluid intake and taking a stool softener (such as Colace) will usually help or prevent this problem from occurring.  A mild  laxative (Milk of Magnesia or Miralax) should be taken according to package instructions if there are no bowel movements after 48 hours.  WOUND/INCISION CARE 8. Most patients will experience some swelling and bruising in the area of the incisions.  Ice packs will help.  Swelling and bruising can take several days to resolve.  9. Unless discharge instructions indicate otherwise, follow guidelines below  a. STERI-STRIPS - you may remove your outer bandages 48 hours after surgery, and you may shower at that time.  You have steri-strips (small skin tapes) in place directly over the incision.  These strips should be left on the skin for 7-10 days.   b. DERMABOND/SKIN GLUE - you may shower in 24 hours.  The glue will flake off over the next 2-3 weeks. 10. Any sutures or staples will be removed at the office during your follow-up visit.  ACTIVITIES 11. You may resume regular (light) daily activities beginning the next day--such as daily self-care, walking, climbing stairs--gradually increasing activities as tolerated.  You may have sexual intercourse when it is comfortable.  Refrain from any heavy lifting or straining until approved by your doctor. a. You may drive when you are no longer taking prescription pain medication, you can comfortably wear a seatbelt, and you can safely maneuver your car and apply brakes.  FOLLOW-UP 12. You should see your doctor in the office for a follow-up appointment approximately 2-3 weeks after your surgery.  You should have been given your post-op/follow-up appointment when your surgery was scheduled.  If you did not receive a post-op/follow-up appointment, make sure that  you call for this appointment within a day or two after you arrive home to insure a convenient appointment time.   WHEN TO CALL YOUR DOCTOR: 1. Fever over 101.0 2. Inability to urinate 3. Continued bleeding from incision. 4. Increased pain, redness, or drainage from the incision. 5. Increasing  abdominal pain  The clinic staff is available to answer your questions during regular business hours.  Please dont hesitate to call and ask to speak to one of the nurses for clinical concerns.  If you have a medical emergency, go to the nearest emergency room or call 911.  A surgeon from [facility] is always on call at the hospital. [address], [place], [place]  [address] ? [address] [address], [place], [place]   [address] [phone] ? [phone] ? FAX [phone] Web site: [email or web address]

## 2018-02-13 NOTE — Anesthesia Postprocedure Evaluation (Signed)
Anesthesia Post Note  Patient: Roosvelt HarpsKaren Ricklefs  Procedure(s) Performed: LAPAROSCOPIC CHOLECYSTECTOMY (N/A )     Patient location during evaluation: PACU Anesthesia Type: General Level of consciousness: awake and alert Pain management: pain level controlled Vital Signs Assessment: post-procedure vital signs reviewed and stable Respiratory status: spontaneous breathing, nonlabored ventilation and respiratory function stable Cardiovascular status: blood pressure returned to baseline and stable Postop Assessment: no apparent nausea or vomiting Anesthetic complications: no    Last Vitals:  Vitals:   02/13/18 1415 02/13/18 1430  BP: (!) 143/87 132/85  Pulse: 88 83  Resp: 11 (!) 9  Temp:    SpO2: 100% 99%    Last Pain:  Vitals:   02/13/18 1345  TempSrc:   PainSc: Asleep                 Lowella CurbWarren Ray Miller

## 2018-02-13 NOTE — Anesthesia Preprocedure Evaluation (Signed)
Anesthesia Evaluation  Patient identified by MRN, date of birth, ID band Patient awake    Reviewed: Allergy & Precautions, NPO status , Patient's Chart, lab work & pertinent test results  Airway Mallampati: II  TM Distance: >3 FB Neck ROM: Full    Dental no notable dental hx.    Pulmonary neg pulmonary ROS,    Pulmonary exam normal breath sounds clear to auscultation       Cardiovascular hypertension, negative cardio ROS Normal cardiovascular exam Rhythm:Regular Rate:Normal     Neuro/Psych  Headaches, Anxiety Depression negative psych ROS   GI/Hepatic negative GI ROS, Neg liver ROS,   Endo/Other  negative endocrine ROS  Renal/GU negative Renal ROS  negative genitourinary   Musculoskeletal negative musculoskeletal ROS (+)   Abdominal (+) + obese,   Peds negative pediatric ROS (+)  Hematology negative hematology ROS (+)   Anesthesia Other Findings   Reproductive/Obstetrics negative OB ROS                             Anesthesia Physical Anesthesia Plan  ASA: II  Anesthesia Plan: General   Post-op Pain Management:    Induction: Intravenous, Rapid sequence and Cricoid pressure planned  PONV Risk Score and Plan: 3 and Ondansetron, Dexamethasone and Midazolam  Airway Management Planned: Oral ETT  Additional Equipment:   Intra-op Plan:   Post-operative Plan: Extubation in OR  Informed Consent: I have reviewed the patients History and Physical, chart, labs and discussed the procedure including the risks, benefits and alternatives for the proposed anesthesia with the patient or authorized representative who has indicated his/her understanding and acceptance.   Dental advisory given  Plan Discussed with: CRNA  Anesthesia Plan Comments:         Anesthesia Quick Evaluation

## 2018-02-13 NOTE — Progress Notes (Signed)
Patient still complaining of moderate to severe surgical site pain with movement.  No nausea reported but will continue to assess for any needed interventions. Levora AngelHannah V Giavonni Fonder, RN

## 2018-02-14 ENCOUNTER — Encounter (HOSPITAL_COMMUNITY): Payer: Self-pay | Admitting: General Surgery

## 2018-02-14 NOTE — Discharge Summary (Signed)
  Patient ID: Melissa Zamora [id number] 47 y.o. January 07, 1971  02/12/2018  Discharge date and time: 02/14/2018   Admitting Physician: Mariella SaaBenjamin T Jamiah Recore  Discharge Physician: Mariella SaaBenjamin T Casimir Barcellos  Admission Diagnoses: RUQ abdominal pain [R10.11] Acute cholecystitis [K81.0]  Discharge Diagnoses: Same  Operations: Procedure(s): LAPAROSCOPIC CHOLECYSTECTOMY  Admission Condition: fair  Discharged Condition: good  Indication for Admission: [age] yof consult from Dr Rush Landmarkegeler for ruq pain. She has several episodes of intermittent ruq pain over past couple months.  She had episode last night after eating that has not abated. Nothing is helping.  No n/v. No fevers. Having normal bowel function.  She came to er and has us showing gallstones, elevated wbc.  Hospital Course: Patient was admitted and started on IV antibiotics.  She was taken to the operating room and underwent laparoscopic cholecystectomy with findings of acute cholecystitis.  The following morning she has some discomfort from the incisions but her preoperative pain is better.  Tolerating fluids.  Ambulatory.  On exam her abdomen is minimally tender.  Wounds are clean.  She is felt ready for discharge.    Disposition: Home  Patient Instructions:  Allergies as of 02/14/2018   No Known Allergies     Medication List    TAKE these medications   buPROPion 300 MG 24 hr tablet Commonly known as:  WELLBUTRIN XL Take 300 mg by mouth daily.   ibuprofen 200 MG tablet Commonly known as:  ADVIL,MOTRIN Take 800 mg by mouth every 4 (four) hours as needed for headache.   methimazole 5 MG tablet Commonly known as:  TAPAZOLE Take 2.5 mg by mouth daily.   oxyCODONE 5 MG immediate release tablet Commonly known as:  Oxy IR/ROXICODONE Take 1 tablet (5 mg total) by mouth every 4 (four) hours as needed for moderate pain.   venlafaxine XR 150 MG 24 hr capsule Commonly known as:  EFFEXOR-XR Take 150 mg by mouth daily with breakfast.        Activity: activity as tolerated Diet: regular diet Wound Care: none needed  Follow-up:  With [facility] in 2 weeks.  Signed: Mariella SaaBenjamin T Amay Mijangos MD, FACS  02/14/2018, 9:30 AM

## 2018-02-14 NOTE — Progress Notes (Signed)
Melissa HarpsKaren Hum to be D/C'd Home per MD order.  Discussed prescriptions and follow up appointments with the patient. Prescriptions given to patient, medication list explained in detail. Pt verbalized understanding.  Allergies as of 02/14/2018   No Known Allergies     Medication List    TAKE these medications   buPROPion 300 MG 24 hr tablet Commonly known as:  WELLBUTRIN XL Take 300 mg by mouth daily.   ibuprofen 200 MG tablet Commonly known as:  ADVIL,MOTRIN Take 800 mg by mouth every 4 (four) hours as needed for headache.   methimazole 5 MG tablet Commonly known as:  TAPAZOLE Take 2.5 mg by mouth daily.   oxyCODONE 5 MG immediate release tablet Commonly known as:  Oxy IR/ROXICODONE Take 1 tablet (5 mg total) by mouth every 4 (four) hours as needed for moderate pain.   venlafaxine XR 150 MG 24 hr capsule Commonly known as:  EFFEXOR-XR Take 150 mg by mouth daily with breakfast.       Vitals:   02/13/18 2027 02/14/18 0635  BP: 135/81 (!) 153/87  Pulse: 90 97  Resp: 20 20  Temp:  98.1 F (36.7 C)  SpO2: 100% 94%    Skin clean, dry and intact without evidence of skin break down, no evidence of skin tears noted. IV catheter discontinued intact. Site without signs and symptoms of complications. Dressing and pressure applied. Pt denies pain at this time. No complaints noted.  An After Visit Summary was printed and given to the patient. Patient escorted via WC, and D/C home via private auto.  Mariann BarterKellie Edan Serratore BSN, RN [facility] Phone [phone]

## 2018-03-28 ENCOUNTER — Encounter (HOSPITAL_COMMUNITY): Payer: Self-pay | Admitting: Emergency Medicine

## 2018-03-28 ENCOUNTER — Other Ambulatory Visit: Payer: Self-pay

## 2018-03-28 ENCOUNTER — Emergency Department (HOSPITAL_COMMUNITY)
Admission: EM | Admit: 2018-03-28 | Discharge: 2018-03-28 | Disposition: A | Discharge status: Home / Self Care | Payer: PRIVATE HEALTH INSURANCE | Attending: Emergency Medicine | Admitting: Emergency Medicine

## 2018-03-28 DIAGNOSIS — Z79899 Other long term (current) drug therapy: Secondary | ICD-10-CM | POA: Insufficient documentation

## 2018-03-28 DIAGNOSIS — I471 Supraventricular tachycardia: Secondary | ICD-10-CM | POA: Diagnosis not present

## 2018-03-28 DIAGNOSIS — I10 Essential (primary) hypertension: Secondary | ICD-10-CM | POA: Insufficient documentation

## 2018-03-28 DIAGNOSIS — R000 Tachycardia, unspecified: Secondary | ICD-10-CM | POA: Diagnosis present

## 2018-03-28 HISTORY — DX: Depression, unspecified: F32.A

## 2018-03-28 HISTORY — DX: Anxiety disorder, unspecified: F41.9

## 2018-03-28 HISTORY — DX: Anemia, unspecified: D64.9

## 2018-03-28 HISTORY — DX: Disorder of thyroid, unspecified: E07.9

## 2018-03-28 HISTORY — DX: Major depressive disorder, single episode, unspecified: F32.9

## 2018-03-28 LAB — CBC WITH DIFFERENTIAL/PLATELET
Abs Immature Granulocytes: 0.04 10*3/uL (ref 0.00–0.07)
BASOS ABS: 0 10*3/uL (ref 0.0–0.1)
Basophils Relative: 0 %
EOS PCT: 1 %
Eosinophils Absolute: 0.2 10*3/uL (ref 0.0–0.5)
HCT: 26.7 % — ABNORMAL LOW (ref 36.0–46.0)
Hemoglobin: 7.6 g/dL — ABNORMAL LOW (ref 12.0–15.0)
IMMATURE GRANULOCYTES: 0 %
LYMPHS PCT: 29 %
Lymphs Abs: 3 10*3/uL (ref 0.7–4.0)
MCH: 19.2 pg — ABNORMAL LOW (ref 26.0–34.0)
MCHC: 28.5 g/dL — ABNORMAL LOW (ref 30.0–36.0)
MCV: 67.4 fL — AB (ref 80.0–100.0)
MONOS PCT: 5 %
Monocytes Absolute: 0.5 10*3/uL (ref 0.1–1.0)
NRBC: 0 % (ref 0.0–0.2)
Neutro Abs: 6.7 10*3/uL (ref 1.7–7.7)
Neutrophils Relative %: 65 %
PLATELETS: 429 10*3/uL — AB (ref 150–400)
RBC: 3.96 MIL/uL (ref 3.87–5.11)
RDW: 17.6 % — ABNORMAL HIGH (ref 11.5–15.5)
WBC: 10.4 10*3/uL (ref 4.0–10.5)

## 2018-03-28 LAB — BASIC METABOLIC PANEL
Anion gap: 6 (ref 5–15)
BUN: 12 mg/dL (ref 6–20)
CHLORIDE: 111 mmol/L (ref 98–111)
CO2: 24 mmol/L (ref 22–32)
CREATININE: 0.81 mg/dL (ref 0.44–1.00)
Calcium: 8.6 mg/dL — ABNORMAL LOW (ref 8.9–10.3)
GFR calc non Af Amer: >60 mL/min (ref >60)
GLUCOSE: 88 mg/dL (ref 70–99)
Potassium: 3.7 mmol/L (ref 3.5–5.1)
Sodium: 141 mmol/L (ref 135–145)

## 2018-03-28 LAB — MAGNESIUM: MAGNESIUM: 2 mg/dL (ref 1.7–2.4)

## 2018-03-28 LAB — TSH: TSH: 1.054 u[IU]/mL (ref 0.350–4.500)

## 2018-03-28 NOTE — ED Provider Notes (Signed)
[facility] EMERGENCY DEPARTMENT Provider Note   CSN: [id number] Arrival date & time: 03/28/18  1853     History   Chief Complaint Chief Complaint  Patient presents with  . Tachycardia    HPI Melissa Zamora is a 47 y.o. female.  She said she is was feeling lousy at home for about 4 hours with ringing in her ears and feeling short of breath.  She stood up and felt like she might pass out.  She ultimately called 911 was found to be going in a narrow complex tachycardia of 190.  They tried some vagal maneuvers with her which did not work and eventually gave her 6 mg adenosine which improved her symptoms.  She said this is never happened before.  She said before she had her thyroid surgery she had episode of both episodes of fast heart rates but never like this.  She started a new medication yesterday to help her stop having periods but she was not sure of the name of that was.  She said she had some Sprite today but no caffeine and does not use any illicit drugs.  Currently feels kind of weak but otherwise no chest pain or shortness of breath no abdominal pain nausea vomiting or diarrhea.  HPI  Past Medical History:  Diagnosis Date  . Anemia   . Anxiety   . Depression   . Hypertension   . Thyroid disease    hyper    Patient Active Problem List   Diagnosis Date Noted  . Acute calculous cholecystitis 02/12/2018  . Cholecystitis 02/12/2018  . Acute cholecystitis 02/12/2018  . Migraine without aura and without status migrainosus, not intractable 05/12/2017  . Anxiety disorder 03/06/2015  . Hypertension 03/06/2015  . Clinical depression 03/06/2015  . Hyperthyroidism 03/06/2015  . Obesity 03/06/2015  . Stress incontinence, female 03/06/2015    Past Surgical History:  Procedure Laterality Date  . CHOLECYSTECTOMY N/A 02/13/2018   Procedure: LAPAROSCOPIC CHOLECYSTECTOMY;  Surgeon: Emelia Loron, MD;  Location: [facility]S;  Service: General;  Laterality: N/A;      OB History   No obstetric history on file.      Home Medications    Prior to Admission medications   Medication Sig Start Date End Date Taking? Authorizing Provider  buPROPion (WELLBUTRIN XL) 300 MG 24 hr tablet Take 300 mg by mouth daily.    [provider]  ibuprofen (ADVIL,MOTRIN) 200 MG tablet Take 800 mg by mouth every 4 (four) hours as needed for headache.    [provider]  methimazole (TAPAZOLE) 5 MG tablet Take 2.5 mg by mouth daily.    [provider]  oxyCODONE (OXY IR/ROXICODONE) 5 MG immediate release tablet Take 1 tablet (5 mg total) by mouth every 4 (four) hours as needed for moderate pain. 02/13/18   Emelia Loron, MD  venlafaxine XR (EFFEXOR-XR) 150 MG 24 hr capsule Take 150 mg by mouth daily with breakfast.    [provider]    Family History No family history on file.  Social History Social History   Tobacco Use  . Smoking status: Never Smoker  . Smokeless tobacco: Never Used  Substance Use Topics  . Alcohol use: Yes  . Drug use: Never     Allergies   Patient has no known allergies.   Review of Systems Review of Systems  Constitutional: Positive for fatigue. Negative for fever.  HENT: Positive for hearing loss. Negative for sore throat.   Eyes: Negative for visual  disturbance.  Respiratory: Negative for shortness of breath.   Cardiovascular: Negative for chest pain.  Gastrointestinal: Positive for nausea. Negative for abdominal pain and vomiting.  Genitourinary: Negative for dysuria.  Musculoskeletal: Negative for neck pain.  Skin: Negative for rash.  Neurological: Positive for dizziness.     Physical Exam Updated Vital Signs BP 109/67 (BP Location: Right Arm)   Pulse 96   Temp 97.7 F (36.5 C) (Oral)   Resp 12   Ht 5\' 2"  (1.575 m)   Wt 93.4 kg   LMP 03/28/2018   SpO2 100%   BMI 37.68 kg/m   Physical Exam Vitals signs and nursing note reviewed.  Constitutional:      General: She is  not in acute distress.    Appearance: She is well-developed.  HENT:     Head: Normocephalic and atraumatic.  Eyes:     Conjunctiva/sclera: Conjunctivae normal.  Neck:     Musculoskeletal: Neck supple.  Cardiovascular:     Rate and Rhythm: Normal rate and regular rhythm.     Pulses: Normal pulses.     Heart sounds: No murmur.  Pulmonary:     Effort: Pulmonary effort is normal. No respiratory distress.     Breath sounds: Normal breath sounds.  Abdominal:     Palpations: Abdomen is soft.     Tenderness: There is no abdominal tenderness.  Musculoskeletal: Normal range of motion.        General: No swelling, tenderness or signs of injury.  Skin:    General: Skin is warm and dry.     Capillary Refill: Capillary refill takes less than 2 seconds.  Neurological:     General: No focal deficit present.     Mental Status: She is alert and oriented to person, place, and time.     Sensory: No sensory deficit.  Psychiatric:        Mood and Affect: Mood normal.      ED Treatments / Results  Labs (all labs ordered are listed, but only abnormal results are displayed) Labs Reviewed  BASIC METABOLIC PANEL - Abnormal; Notable for the following components:      Result Value   Calcium 8.6 (*)    All other components within normal limits  CBC WITH DIFFERENTIAL/PLATELET - Abnormal; Notable for the following components:   Hemoglobin 7.6 (*)    HCT 26.7 (*)    MCV 67.4 (*)    MCH 19.2 (*)    MCHC 28.5 (*)    RDW 17.6 (*)    Platelets 429 (*)    All other components within normal limits  MAGNESIUM  TSH    EKG EKG Interpretation  Date/Time:  Saturday March 28 2018 18:58:19 EST Ventricular Rate:  97 PR Interval:    QRS Duration: 102 QT Interval:  389 QTC Calculation: 495 R Axis:   15 Text Interpretation:  Sinus rhythm RSR' in V1 or V2, right VCD or RVH Borderline prolonged QT interval no prior to compare with Confirmed by Meridee ScoreButler, Eaton Folmar [phone]([id number]) on 03/28/2018 7:31:16  PM   Radiology No results found.  Procedures Procedures (including critical care time)  Medications Ordered in ED Medications - No data to display   Initial Impression / Assessment and Plan / ED Course  I have reviewed the triage vital signs and the nursing notes.  Pertinent labs & imaging results that were available during my care of the patient were reviewed by me and considered in my medical decision making (see chart for details).  Clinical Course as of Mar 29 1314  Sat Mar 28, 2018  2209 Patient's orthostatics were good here.  Her anemia is pretty much baseline for her.  TSH magnesium electrolytes renal function are all normal.  She is feeling improved.   [MB]    Clinical Course User Index [MB] Terrilee FilesButler, Abeni Finchum C, MD    Final Clinical Impressions(s) / ED Diagnoses   Final diagnoses:  SVT (supraventricular tachycardia) [facility](HCC)    ED Discharge Orders    None       Terrilee FilesButler, Daimen Shovlin C, MD 03/29/18 1315

## 2018-03-28 NOTE — Discharge Instructions (Addendum)
You were seen in the emergency department for feeling very lightheaded and were found to have a very elevated heart rate.  The paramedics gave you some medicine which broke that cycle and improved her symptoms.  You had some basic blood work testing here that showed you were anemic but close to your baseline.  You will need to follow-up with your primary care doctor and recommend to them that you need a Holter monitor.  I am also giving you the number for cardiology to schedule an appointment.  Please return if any worsening symptoms.

## 2018-03-28 NOTE — ED Triage Notes (Signed)
Per [facility] pt coming from home about 4 hours ago had a pressure in her chest and shortness of breath. EMS reports patient was ambulatory on scene and HR in the 190s. EMS administered adenosine and 4mg  zofran. Patient denies any pain at present time. HR currently 93.

## 2019-06-21 IMAGING — US US ABDOMEN LIMITED
1 series · 14 of 25 positions shown · non-contrast
Comparison: None.

CLINICAL DATA: Acute onset of right upper quadrant abdominal pain.

EXAM:
ULTRASOUND ABDOMEN LIMITED RIGHT UPPER QUADRANT

[Series 1: us abdomen limited · 14 of 55 slices shown]
[im 1/55]
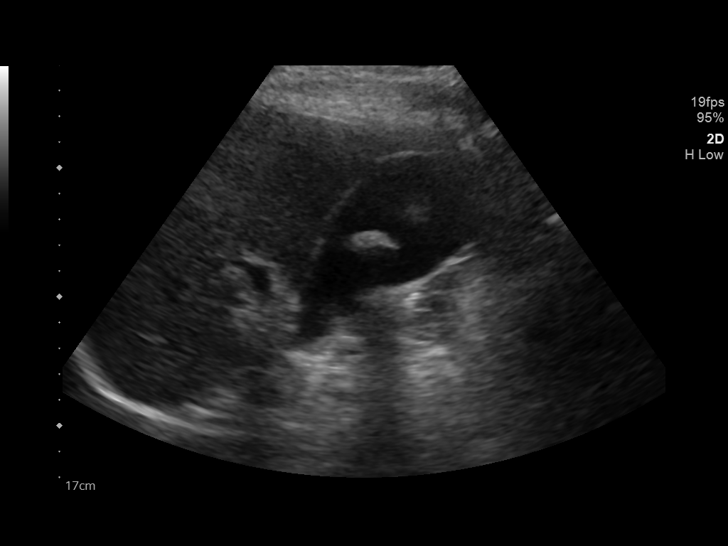
[im 5/55]
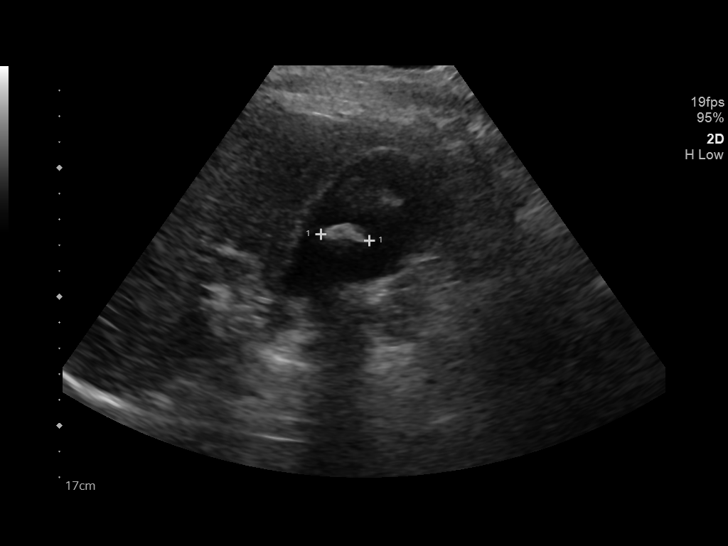
[im 10/55]
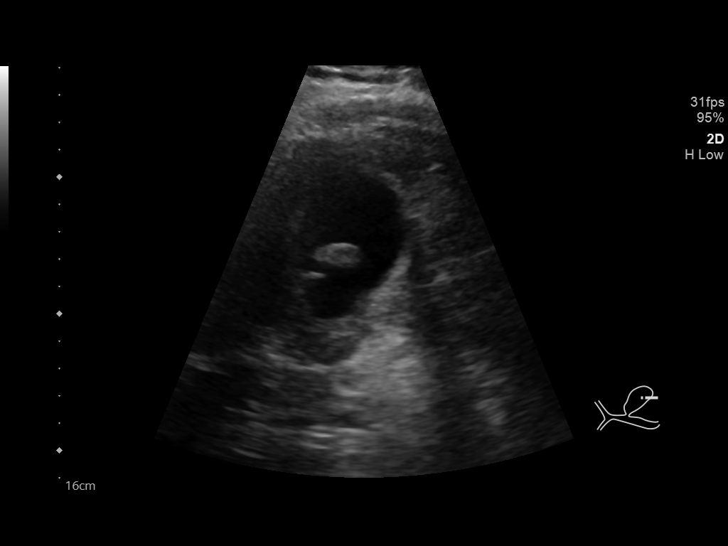
[im 14/55]
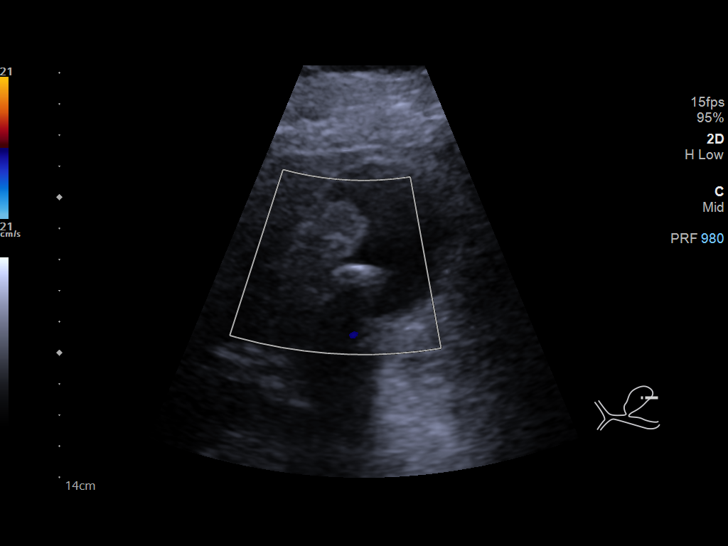
[im 19/55]
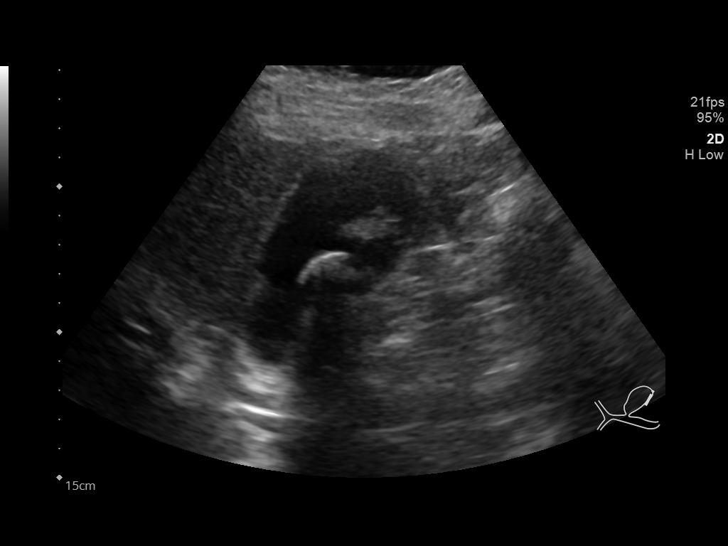
[im 21/55]
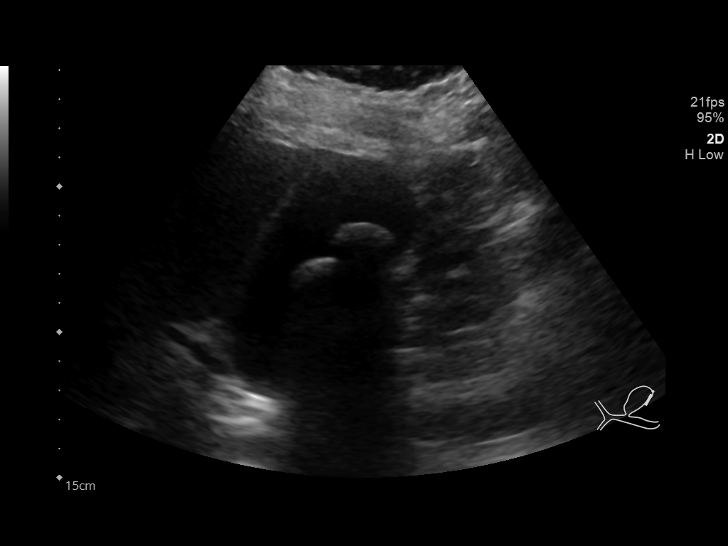
[im 25/55]
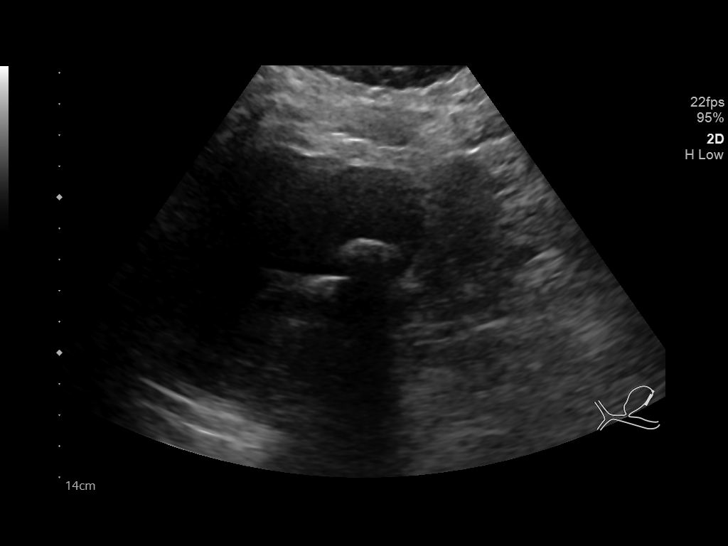
[im 30/55]
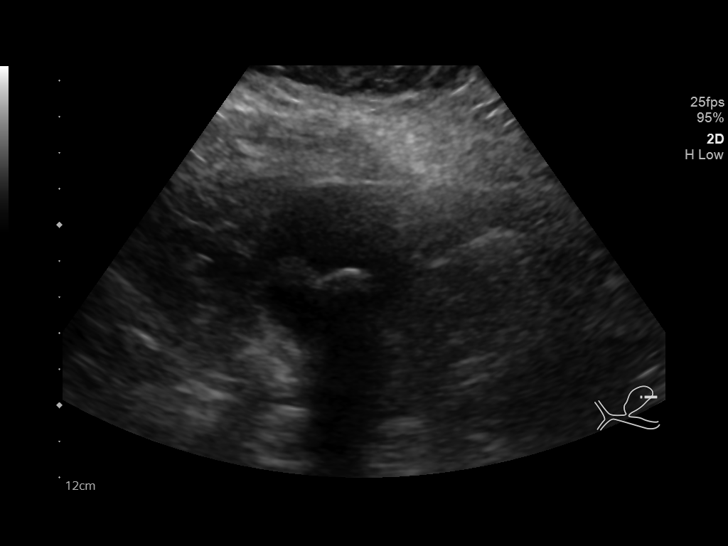
[im 34/55]
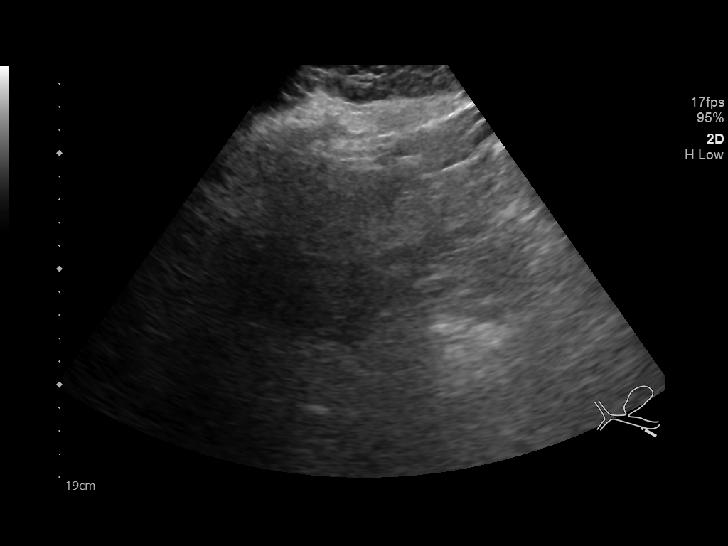
[im 37/55]
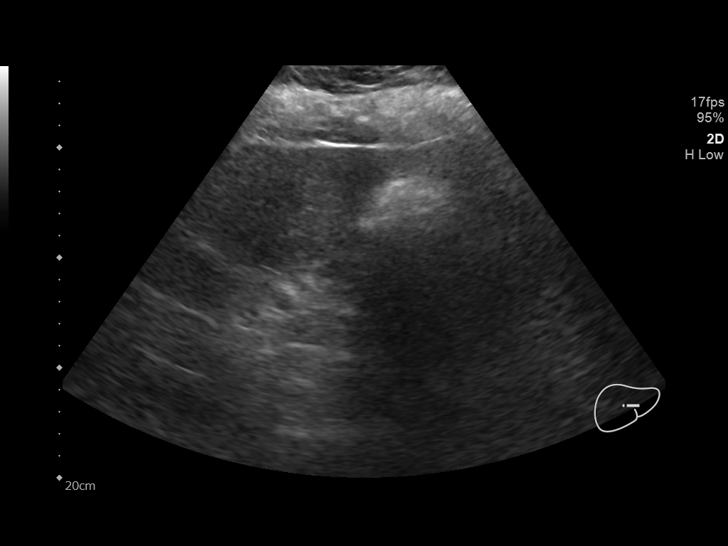
[im 41/55]
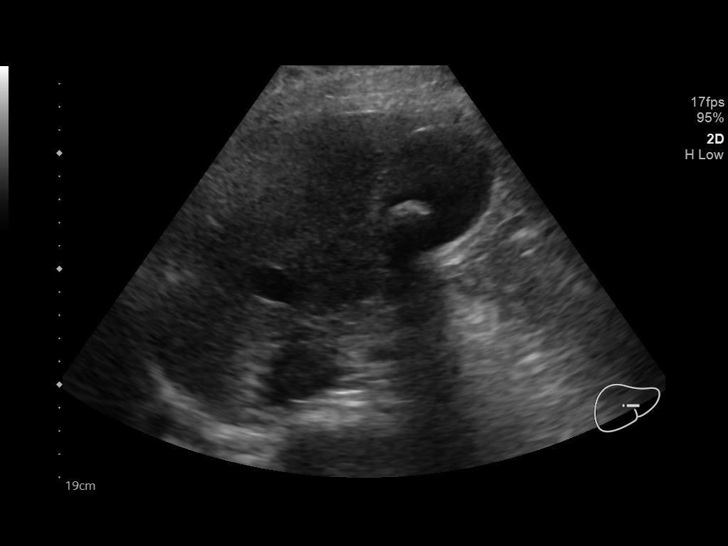
[im 46/55]
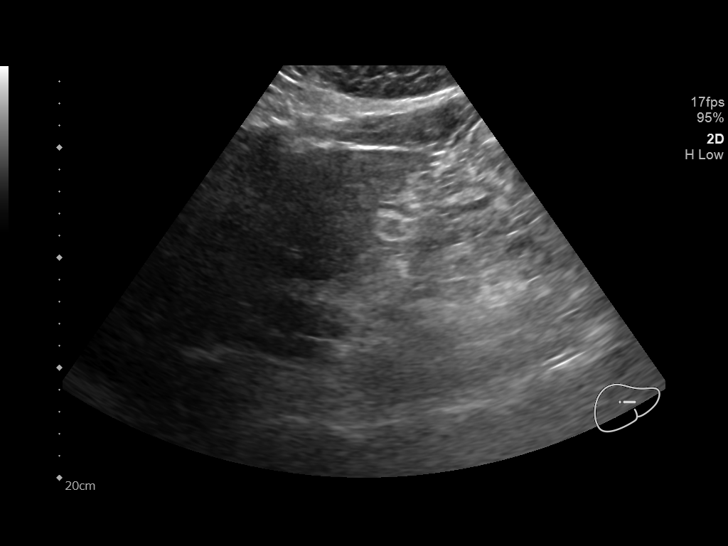
[im 50/55]
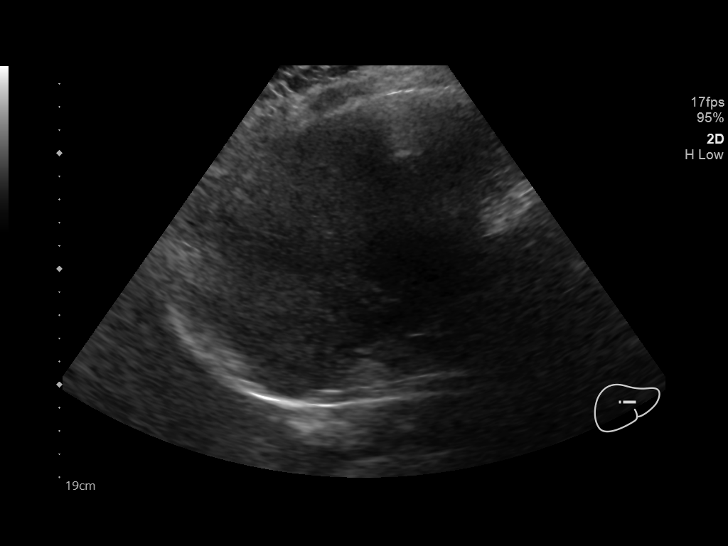
[im 55/55]
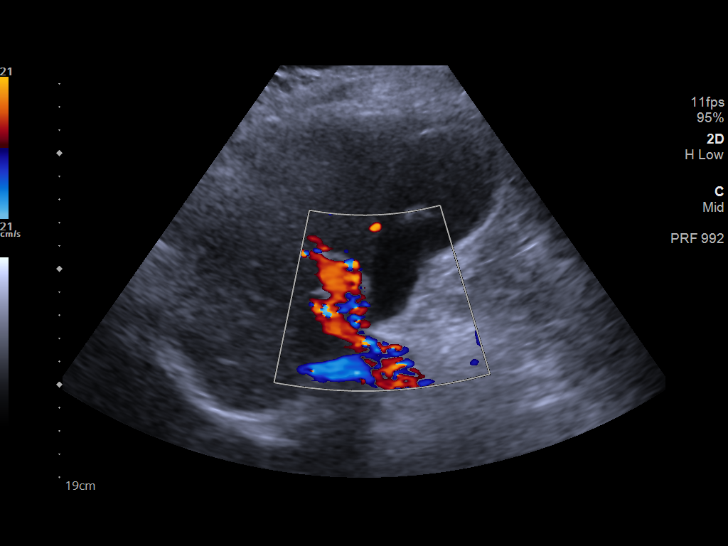

[14 of 25 positions shown; findings below may reference images not displayed]

FINDINGS: Gallbladder:

Gallstones and sludge are noted within the gallbladder. Stones
measure up to 1.9 cm in size. No ultrasonographic Murphy's sign is
elicited. No pericholecystic fluid or gallbladder wall thickening is
appreciated.

Common bile duct:

Diameter: 0.5 cm, within normal limits in caliber.

Liver:

A 2.4 x 1.8 x 1.8 cm cyst is noted at the right hepatic lobe. Within
normal limits in parenchymal echogenicity. Portal vein is patent on
color Doppler imaging with normal direction of blood flow towards
the liver.
IMPRESSION: 1. No acute abnormality seen at the right upper quadrant.
2. Stones and sludge noted within the gallbladder. Gallbladder
otherwise unremarkable in appearance.
3. 2.4 cm right hepatic cyst noted.
